# Patient Record
Sex: Male | Born: 2000 | Race: White | Hispanic: No | Marital: Single | Smoking: Never smoker
Health system: Southern US, Community
[De-identification: ages and names within clinical notes are randomized; demographics above are authoritative.]

## PROBLEM LIST (undated history)

## (undated) HISTORY — PX: SHOULDER SURGERY: SHX246

---
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
## 2023-03-09 IMAGING — XA DG FLUORO GUIDE NDL PLC/BX
3 series · 3 of 3 positions shown · non-contrast
Comparison: none

CLINICAL DATA: Previous labral repair.  Instability.

[Series 1: ortho standard · 1 of 1 slices shown (1 of 3)]
[im 1/1]
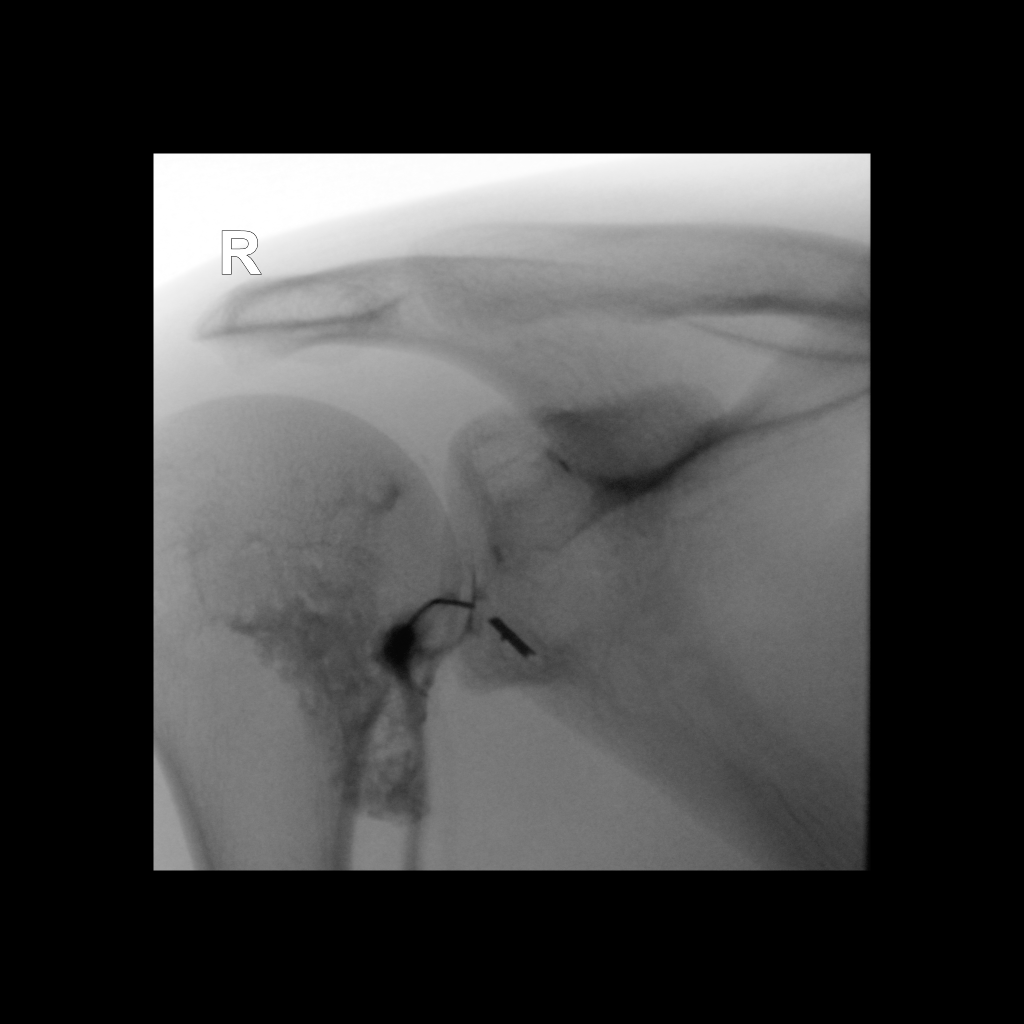

[Series 2: ortho standard · 1 of 1 slices shown (2 of 3)]
[im 1/1]
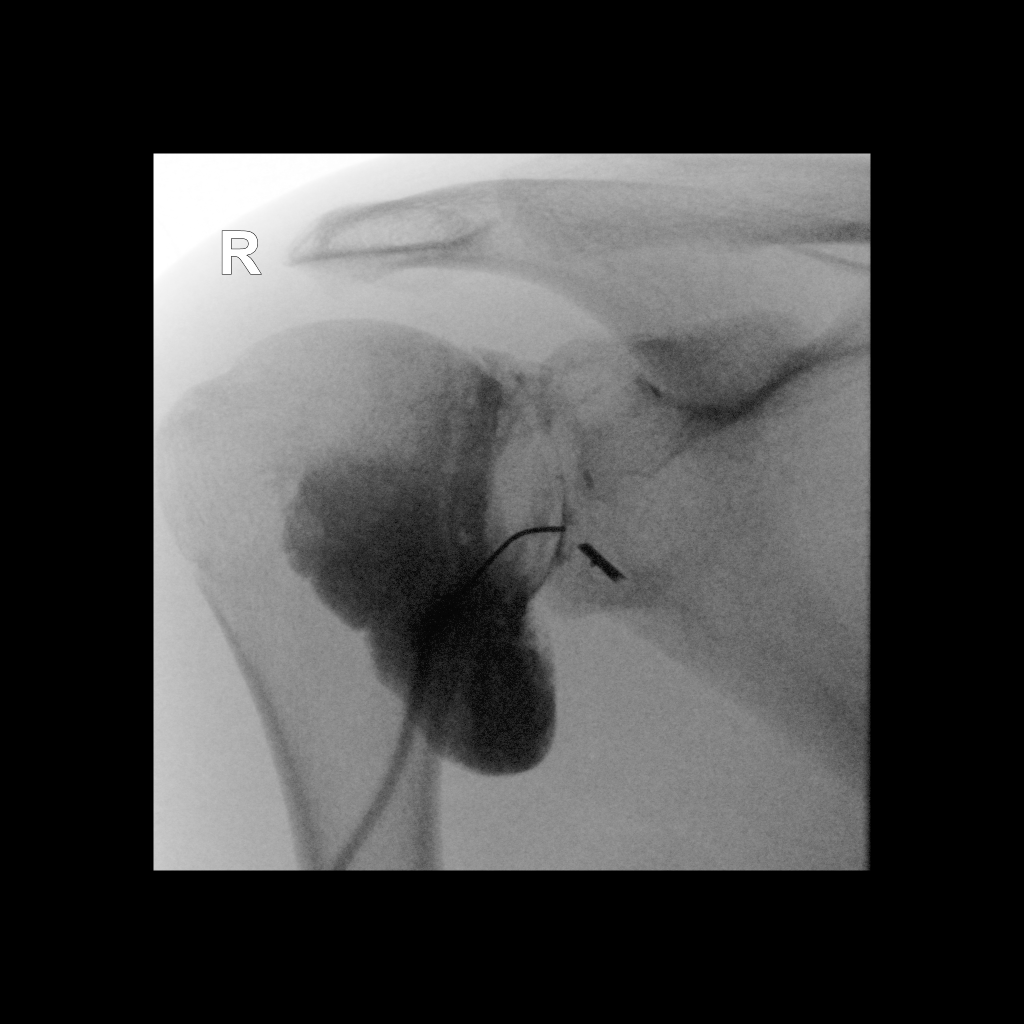

[Series 3: ortho standard · 1 of 1 slices shown (3 of 3)]
[im 1/1]
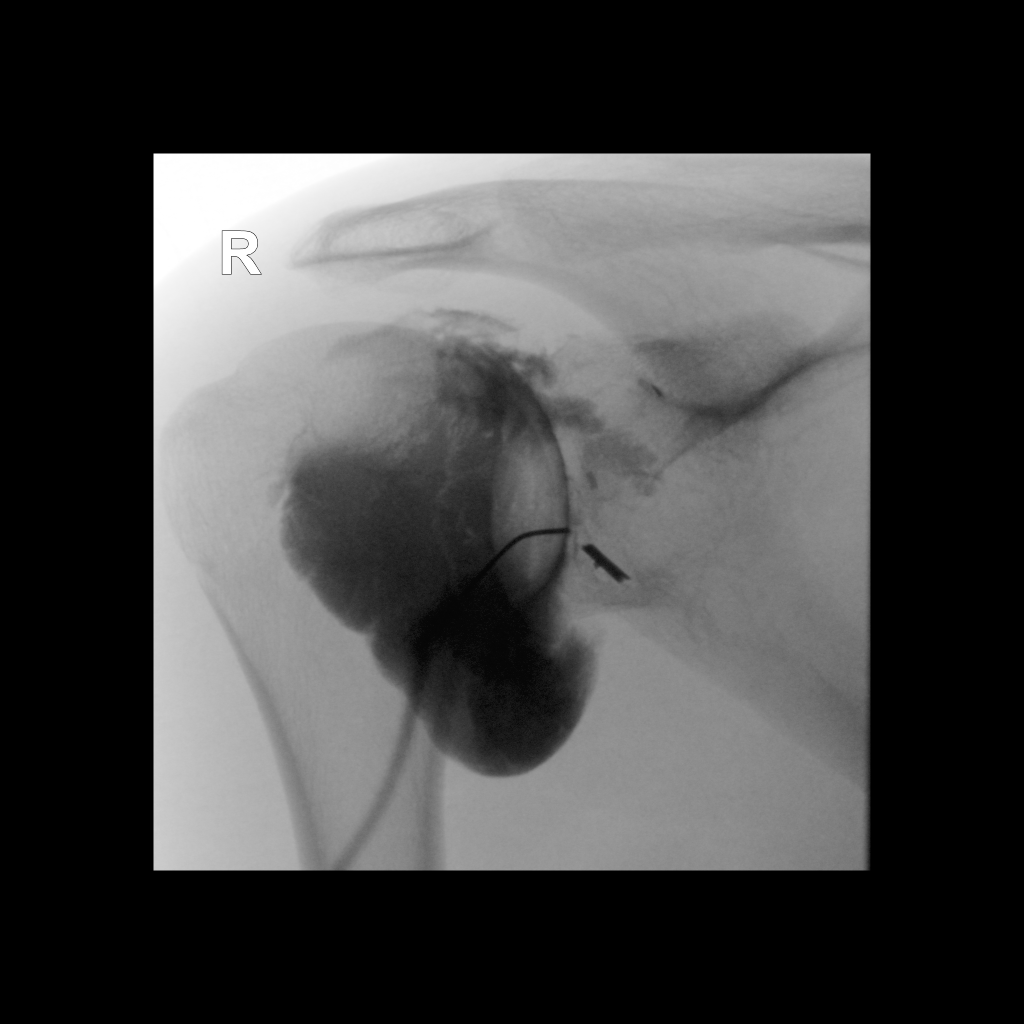

[3 of 3 positions shown; findings below may reference images not displayed]

FLUOROSCOPY TIME:  0 minutes 44 seconds. 19.24 micro gray meter
squared

PROCEDURE:
Right SHOULDER INJECTION UNDER FLUOROSCOPY

Procedure was performed by Kaki Jim PA. An appropriate skin
entrance site was determined. The site was marked, prepped with
Betadine, draped in the usual sterile fashion, and infiltrated
locally with Lidocaine. 22 gauge spinal needle was advanced to the
medial margin of the humeral head under intermittent fluoroscopy. 20
cc of dilute Isovue 200 were instilled prior to CT arthrography.
IMPRESSION: Technically successful right shoulder injection for CT arthrography

## 2023-03-09 IMAGING — CT CT SHOULDER*R* W/CM
1 of 3 series · 7 of 14 positions shown, 9 images · non-contrast
Comparison: Injection images same date, radiographs 11/02/2020 and
MR arthrogram 11/21/2020.

CLINICAL DATA: Right shoulder pain. History of rotator cuff repair

EXAM:
CT ARTHROGRAPHY OF THE RIGHT SHOULDER
TECHNIQUE: Multidetector CT imaging was performed following the standard
protocol after injection of dilute contrast into the joint.

[Series 5: thin soft · axial · 0.54mm/px · z∈[+801,+939]mm · 7 of 309 slices shown, 9 images]
[im 39/309  soft-tissue]
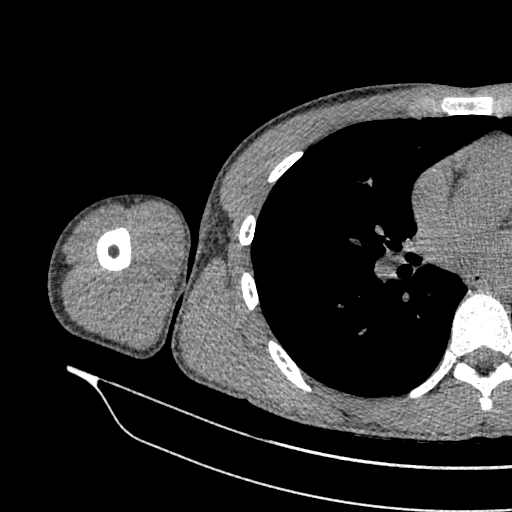
[im 39/309  bone]
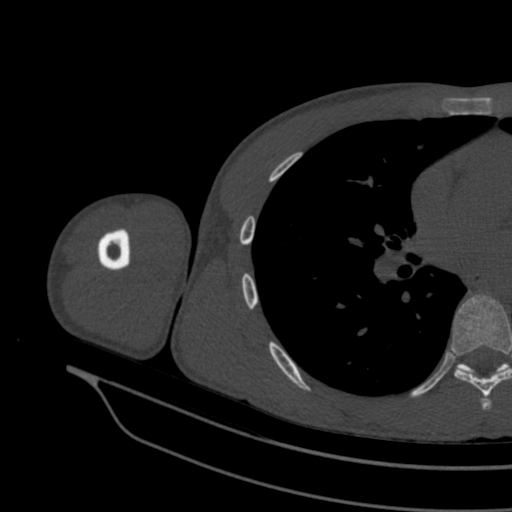
[im 78/309  bone]
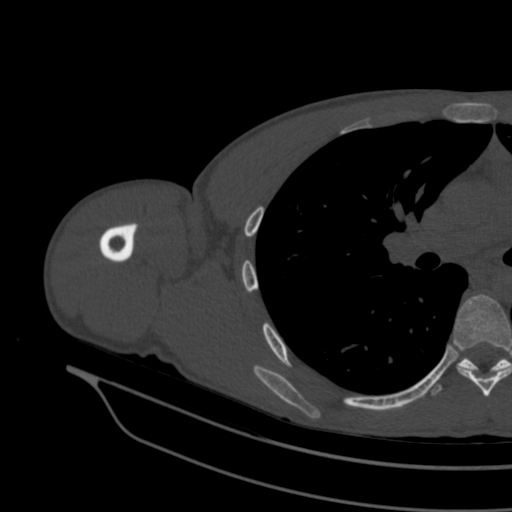
[im 116/309  bone]
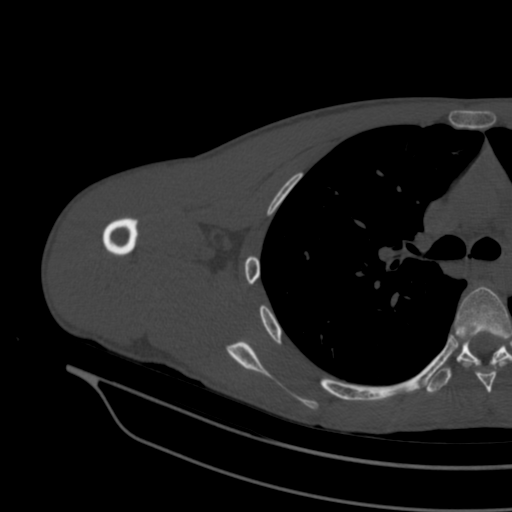
[im 155/309  bone]
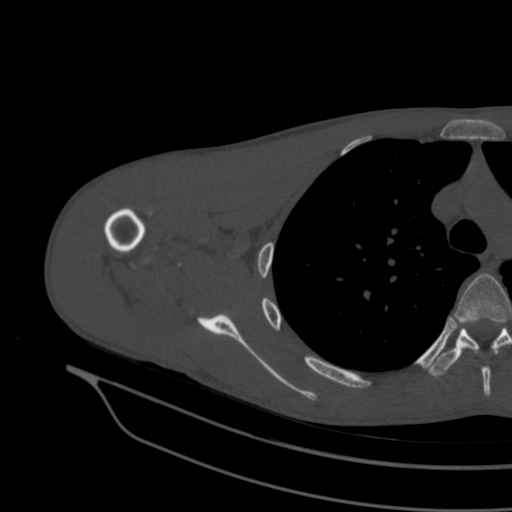
[im 193/309  soft-tissue]
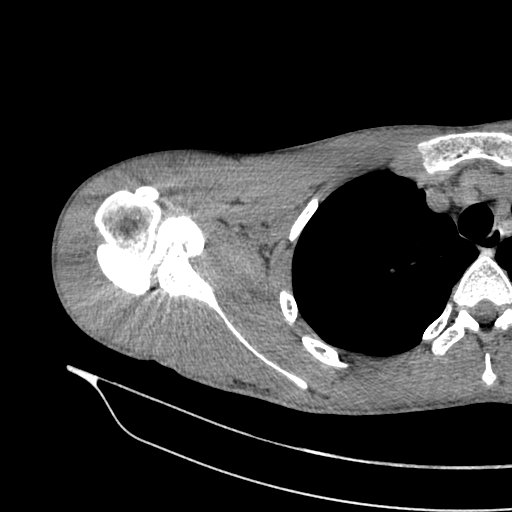
[im 193/309  bone]
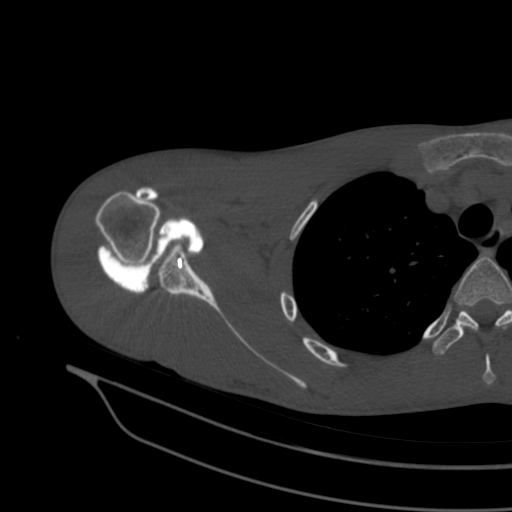
[im 232/309  bone]
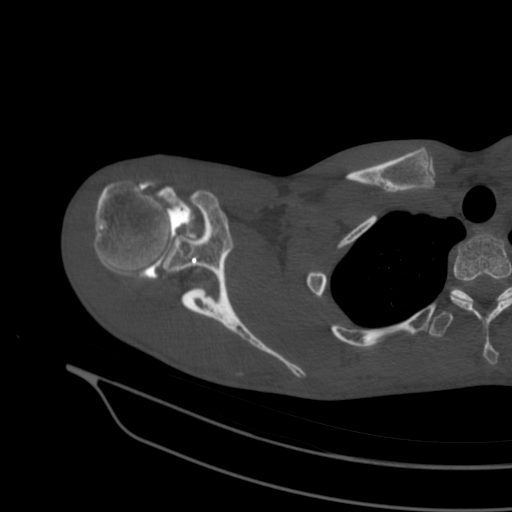
[im 270/309  bone]
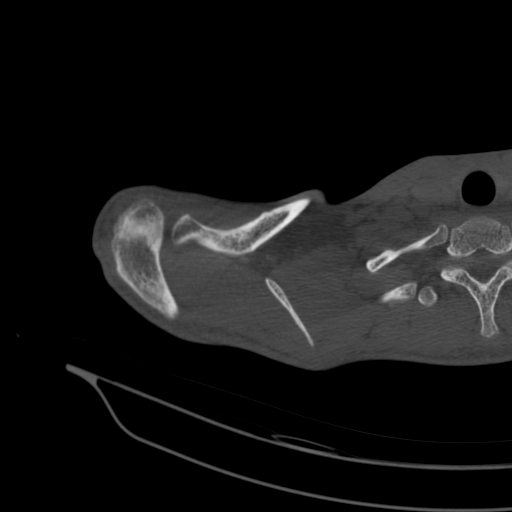

[7 of 14 positions shown; findings below may reference images not displayed]

FINDINGS: Bones/Joint/Cartilage

Postsurgical changes in the anterior glenoid consistent with
previous labral repair. The largest linear metallic density
associated with the most inferior glenoid defect extends lateral to
the glenoid cortex and may protrude slightly into the joint.
However, no definite displaced hardware or recurrent labral tear
identified. The glenohumeral articular cartilage appears preserved.
No evidence of acute fracture, dislocation or avascular necrosis.
Mild chronic Hill-Sachs deformity of the humeral head. Mild joint
synovial irregularity noted.

Ligaments

Ligaments are suboptimally evaluated by CT. The glenohumeral
ligaments appear intact.

Muscles and Tendons
A small amount of contrast appears to extend into the substance of
the distal supraspinatus tendon anteriorly, best seen on the
reformatted images. There is no full-thickness rotator cuff tear or
contrast extension into the subacromial-subdeltoid bursa. No focal
muscular atrophy identified. The biceps tendon appears intact.

Soft Tissues
No periarticular fluid collections or unexpected foreign bodies.
IMPRESSION: 1. Evidence of small partial articular surface insertional tear of
the supraspinatus tendon. No full-thickness rotator cuff tear or
focal muscular atrophy.
2. Postsurgical changes in the anterior glenoid. The most inferior
metallic pen may protrude slightly into the glenohumeral joint, but
no recurrent labral tear identified.
3. Chronic Hill-Sachs deformity of the humeral head. No acute
osseous findings.
